# Patient Record
Sex: Female | Born: 1950 | Race: White | Hispanic: No | Smoking: Former smoker
Health system: Southern US, Community
[De-identification: ages and names within clinical notes are randomized; demographics above are authoritative.]

## PROBLEM LIST (undated history)

## (undated) DIAGNOSIS — J189 Pneumonia, unspecified organism: Secondary | ICD-10-CM

## (undated) DIAGNOSIS — R636 Underweight: Secondary | ICD-10-CM

## (undated) DIAGNOSIS — J690 Pneumonitis due to inhalation of food and vomit: Secondary | ICD-10-CM

## (undated) DIAGNOSIS — F0280 Dementia in other diseases classified elsewhere without behavioral disturbance: Secondary | ICD-10-CM

## (undated) DIAGNOSIS — R296 Repeated falls: Secondary | ICD-10-CM

## (undated) DIAGNOSIS — I639 Cerebral infarction, unspecified: Secondary | ICD-10-CM

## (undated) DIAGNOSIS — I2510 Atherosclerotic heart disease of native coronary artery without angina pectoris: Secondary | ICD-10-CM

## (undated) DIAGNOSIS — N289 Disorder of kidney and ureter, unspecified: Secondary | ICD-10-CM

## (undated) DIAGNOSIS — L89310 Pressure ulcer of right buttock, unstageable: Secondary | ICD-10-CM

## (undated) DIAGNOSIS — R1312 Dysphagia, oropharyngeal phase: Secondary | ICD-10-CM

## (undated) DIAGNOSIS — I252 Old myocardial infarction: Secondary | ICD-10-CM

## (undated) DIAGNOSIS — H269 Unspecified cataract: Secondary | ICD-10-CM

## (undated) DIAGNOSIS — F329 Major depressive disorder, single episode, unspecified: Secondary | ICD-10-CM

## (undated) DIAGNOSIS — F32A Depression, unspecified: Secondary | ICD-10-CM

## (undated) DIAGNOSIS — F209 Schizophrenia, unspecified: Secondary | ICD-10-CM

## (undated) DIAGNOSIS — Z515 Encounter for palliative care: Secondary | ICD-10-CM

## (undated) DIAGNOSIS — G309 Alzheimer's disease, unspecified: Secondary | ICD-10-CM

## (undated) DIAGNOSIS — M419 Scoliosis, unspecified: Secondary | ICD-10-CM

## (undated) DIAGNOSIS — S0285XA Fracture of orbit, unspecified, initial encounter for closed fracture: Secondary | ICD-10-CM

## (undated) HISTORY — DX: Old myocardial infarction: I25.2

## (undated) HISTORY — DX: Unspecified cataract: H26.9

## (undated) HISTORY — DX: Underweight: R63.6

## (undated) HISTORY — DX: Depression, unspecified: F32.A

## (undated) HISTORY — DX: Encounter for palliative care: Z51.5

## (undated) HISTORY — DX: Atherosclerotic heart disease of native coronary artery without angina pectoris: I25.10

## (undated) HISTORY — DX: Major depressive disorder, single episode, unspecified: F32.9

## (undated) HISTORY — PX: CORONARY ANGIOPLASTY WITH STENT PLACEMENT: SHX49

## (undated) HISTORY — DX: Dysphagia, oropharyngeal phase: R13.12

## (undated) HISTORY — DX: Pressure ulcer of right buttock, unstageable: L89.310

## (undated) HISTORY — DX: Repeated falls: R29.6

---

## 2012-05-25 ENCOUNTER — Other Ambulatory Visit: Payer: Self-pay | Admitting: Family Medicine

## 2012-05-25 ENCOUNTER — Ambulatory Visit
Admission: RE | Admit: 2012-05-25 | Discharge: 2012-05-25 | Disposition: A | Discharge status: Home / Self Care | Payer: No Typology Code available for payment source | Source: Ambulatory Visit | Attending: Family Medicine | Admitting: Family Medicine

## 2012-05-25 DIAGNOSIS — M25476 Effusion, unspecified foot: Secondary | ICD-10-CM

## 2012-05-25 DIAGNOSIS — M79672 Pain in left foot: Secondary | ICD-10-CM

## 2012-08-22 ENCOUNTER — Ambulatory Visit
Admission: RE | Admit: 2012-08-22 | Discharge: 2012-08-22 | Disposition: A | Discharge status: Home / Self Care | Payer: No Typology Code available for payment source | Source: Ambulatory Visit | Attending: Family Medicine | Admitting: Family Medicine

## 2012-08-22 ENCOUNTER — Other Ambulatory Visit: Payer: Self-pay | Admitting: Family Medicine

## 2012-08-22 DIAGNOSIS — IMO0001 Reserved for inherently not codable concepts without codable children: Secondary | ICD-10-CM

## 2013-06-26 ENCOUNTER — Other Ambulatory Visit: Payer: Self-pay | Admitting: *Deleted

## 2013-06-26 ENCOUNTER — Ambulatory Visit
Admission: RE | Admit: 2013-06-26 | Discharge: 2013-06-26 | Disposition: A | Discharge status: Home / Self Care | Payer: No Typology Code available for payment source | Source: Ambulatory Visit | Attending: *Deleted | Admitting: *Deleted

## 2013-06-26 DIAGNOSIS — R6883 Chills (without fever): Secondary | ICD-10-CM

## 2013-09-12 ENCOUNTER — Emergency Department (HOSPITAL_BASED_OUTPATIENT_CLINIC_OR_DEPARTMENT_OTHER): Payer: Medicare (Managed Care)

## 2013-09-12 ENCOUNTER — Emergency Department (HOSPITAL_BASED_OUTPATIENT_CLINIC_OR_DEPARTMENT_OTHER)
Admission: EM | Admit: 2013-09-12 | Discharge: 2013-09-12 | Disposition: A | Discharge status: Home / Self Care | Payer: Medicare (Managed Care) | Attending: Emergency Medicine | Admitting: Emergency Medicine

## 2013-09-12 ENCOUNTER — Encounter (HOSPITAL_BASED_OUTPATIENT_CLINIC_OR_DEPARTMENT_OTHER): Payer: Self-pay | Admitting: Emergency Medicine

## 2013-09-12 DIAGNOSIS — W19XXXA Unspecified fall, initial encounter: Secondary | ICD-10-CM

## 2013-09-12 DIAGNOSIS — Z8673 Personal history of transient ischemic attack (TIA), and cerebral infarction without residual deficits: Secondary | ICD-10-CM | POA: Insufficient documentation

## 2013-09-12 DIAGNOSIS — Z8701 Personal history of pneumonia (recurrent): Secondary | ICD-10-CM | POA: Insufficient documentation

## 2013-09-12 DIAGNOSIS — Z8781 Personal history of (healed) traumatic fracture: Secondary | ICD-10-CM | POA: Insufficient documentation

## 2013-09-12 DIAGNOSIS — Z880 Allergy status to penicillin: Secondary | ICD-10-CM | POA: Insufficient documentation

## 2013-09-12 DIAGNOSIS — F209 Schizophrenia, unspecified: Secondary | ICD-10-CM | POA: Insufficient documentation

## 2013-09-12 DIAGNOSIS — Z87448 Personal history of other diseases of urinary system: Secondary | ICD-10-CM | POA: Insufficient documentation

## 2013-09-12 DIAGNOSIS — S0180XA Unspecified open wound of other part of head, initial encounter: Secondary | ICD-10-CM | POA: Insufficient documentation

## 2013-09-12 DIAGNOSIS — Y92009 Unspecified place in unspecified non-institutional (private) residence as the place of occurrence of the external cause: Secondary | ICD-10-CM | POA: Insufficient documentation

## 2013-09-12 DIAGNOSIS — Z87891 Personal history of nicotine dependence: Secondary | ICD-10-CM | POA: Insufficient documentation

## 2013-09-12 DIAGNOSIS — Z9861 Coronary angioplasty status: Secondary | ICD-10-CM | POA: Insufficient documentation

## 2013-09-12 DIAGNOSIS — Z8739 Personal history of other diseases of the musculoskeletal system and connective tissue: Secondary | ICD-10-CM | POA: Insufficient documentation

## 2013-09-12 DIAGNOSIS — Z79899 Other long term (current) drug therapy: Secondary | ICD-10-CM | POA: Insufficient documentation

## 2013-09-12 DIAGNOSIS — S51009A Unspecified open wound of unspecified elbow, initial encounter: Secondary | ICD-10-CM | POA: Insufficient documentation

## 2013-09-12 DIAGNOSIS — Y939 Activity, unspecified: Secondary | ICD-10-CM | POA: Insufficient documentation

## 2013-09-12 DIAGNOSIS — W1809XA Striking against other object with subsequent fall, initial encounter: Secondary | ICD-10-CM | POA: Insufficient documentation

## 2013-09-12 DIAGNOSIS — G309 Alzheimer's disease, unspecified: Secondary | ICD-10-CM | POA: Insufficient documentation

## 2013-09-12 DIAGNOSIS — Z23 Encounter for immunization: Secondary | ICD-10-CM | POA: Insufficient documentation

## 2013-09-12 DIAGNOSIS — Z7982 Long term (current) use of aspirin: Secondary | ICD-10-CM | POA: Insufficient documentation

## 2013-09-12 DIAGNOSIS — F0280 Dementia in other diseases classified elsewhere without behavioral disturbance: Secondary | ICD-10-CM | POA: Insufficient documentation

## 2013-09-12 DIAGNOSIS — S0191XA Laceration without foreign body of unspecified part of head, initial encounter: Secondary | ICD-10-CM

## 2013-09-12 HISTORY — DX: Cerebral infarction, unspecified: I63.9

## 2013-09-12 HISTORY — DX: Scoliosis, unspecified: M41.9

## 2013-09-12 HISTORY — DX: Alzheimer's disease, unspecified: G30.9

## 2013-09-12 HISTORY — DX: Fracture of orbit, unspecified, initial encounter for closed fracture: S02.85XA

## 2013-09-12 HISTORY — DX: Disorder of kidney and ureter, unspecified: N28.9

## 2013-09-12 HISTORY — DX: Schizophrenia, unspecified: F20.9

## 2013-09-12 HISTORY — DX: Pneumonia, unspecified organism: J18.9

## 2013-09-12 HISTORY — DX: Dementia in other diseases classified elsewhere, unspecified severity, without behavioral disturbance, psychotic disturbance, mood disturbance, and anxiety: F02.80

## 2013-09-12 MED ORDER — TETANUS-DIPHTH-ACELL PERTUSSIS 5-2.5-18.5 LF-MCG/0.5 IM SUSP
0.5000 mL | Freq: Once | INTRAMUSCULAR | Status: AC
  Administered 2013-09-12: 0.5 mL via INTRAMUSCULAR
  Filled 2013-09-12: qty 0.5

## 2013-09-12 NOTE — ED Notes (Signed)
Pt lost balance at home and fell forward onto face on kitchen floor.  Lac to right eyebrow and skin tear to right elbow. No LOC.  No blood thinners.

## 2013-09-12 NOTE — ED Provider Notes (Signed)
CSN: [id number]     Arrival date & time 09/12/13  1739 History   First MD Initiated Contact with Patient 09/12/13 1752     Chief Complaint  Patient presents with  . Fall  . Facial Laceration   (Consider location/radiation/quality/duration/timing/severity/associated sxs/prior Treatment) HPI  Schatzman stuttered is a 62 year old female with a past medical history of Alzheimer's dementia and schizophrenia who presents to the emergency department after fall.  There is a level 5 caveat 2 to patient's mental status and dementia.  According to nursing notes the patient fell at home onto the kitchen floor and suffered a laceration to the right eyebrow as well as a small skin tear to the right elbow.  Patient is not taking any blood thinners.  Past Medical History  Diagnosis Date  . Scoliosis   . Alzheimer disease   . Schizophrenia   . CVA (cerebral infarction)   . Pneumonia   . Renal disorder   . Orbital fracture    Past Surgical History  Procedure Laterality Date  . Coronary angioplasty with stent placement     No family history on file. History  Substance Use Topics  . Smoking status: Former Games developer  . Smokeless tobacco: Not on file  . Alcohol Use: No   OB History   Grav Para Term Preterm Abortions TAB SAB Ect Mult Living                 Review of Systems  Unable to perform ROS   Allergies  Penicillins  Home Medications   Current Outpatient Rx  Name  Route  Sig  Dispense  Refill  . ARIPiprazole (ABILIFY) 30 MG tablet   Oral   Take 30 mg by mouth daily.         Marland Kitchen aspirin 81 MG tablet   Oral   Take 81 mg by mouth daily.         . clonazePAM (KLONOPIN) 1 MG tablet   Oral   Take 0.5 mg by mouth 2 (two) times daily as needed for anxiety.         . divalproex (DEPAKOTE) 250 MG DR tablet   Oral   Take 500 mg by mouth daily.         . Multiple Vitamins-Minerals (MULTIVITAMIN WITH MINERALS) tablet   Oral   Take 1 tablet by mouth daily.         .  sertraline (ZOLOFT) 100 MG tablet   Oral   Take 100 mg by mouth daily.         Marland Kitchen UNABLE TO FIND      Unable to obtain med information.          BP 149/84  Pulse 76  Temp(Src) 97.7 F (36.5 C) (Oral)  Resp 16  Ht 5\' 5"  (1.651 m)  Wt 125 lb (56.7 kg)  BMI 20.8 kg/m2  SpO2 98% Physical Exam  Constitutional: She is oriented to person, place, and time. She appears well-developed and well-nourished. No distress.  HENT:  1 cm laceration to the right eyebrow.  Bleeding is well controlled  Eyes: Conjunctivae are normal. No scleral icterus.  Neck: Normal range of motion.  C collar in place  Cardiovascular: Normal rate, regular rhythm and normal heart sounds.  Exam reveals no gallop and no friction rub.   No murmur heard. Pulmonary/Chest: Effort normal and breath sounds normal. No respiratory distress.  Abdominal: Soft. Bowel sounds are normal. She exhibits no distension and no mass. There is no  tenderness. There is no guarding.  Musculoskeletal:  Bruising or deformity.  No ecchymosis noted.  Neurological: She is alert and oriented to person, place, and time.  Skin: Skin is warm and dry. She is not diaphoretic.  Small skin tear R elbow.     ED Course  Procedures (including critical care time) Labs Review Labs Reviewed - No data to display Imaging Review No results found.  MDM  No diagnosis found. Patient here after fall. She arrives via ems on spinal precautions.  T dap updated . Labs/ imaging pending.  Imaging and labs without acute abnormality. Lac repair with dermabond. The patient appears reasonably screened and/or stabilized for discharge and I doubt any other medical condition or other [facility] requiring further screening, evaluation, or treatment in the ED at this time prior to discharge.   Arthor Captain, PA-C 09/15/13 [phone]

## 2013-09-13 ENCOUNTER — Other Ambulatory Visit: Payer: Self-pay | Admitting: *Deleted

## 2013-09-13 ENCOUNTER — Ambulatory Visit
Admission: RE | Admit: 2013-09-13 | Discharge: 2013-09-13 | Disposition: A | Discharge status: Home / Self Care | Payer: No Typology Code available for payment source | Source: Ambulatory Visit | Attending: *Deleted | Admitting: *Deleted

## 2013-09-13 DIAGNOSIS — W19XXXA Unspecified fall, initial encounter: Secondary | ICD-10-CM

## 2013-09-15 NOTE — ED Provider Notes (Signed)
Medical screening examination/treatment/procedure(s) were performed by non-physician practitioner and as supervising physician I was immediately available for consultation/collaboration.   Charles B. Sheldon, MD 09/15/13 2031 

## 2013-10-31 ENCOUNTER — Other Ambulatory Visit: Payer: Self-pay | Admitting: Family Medicine

## 2013-10-31 ENCOUNTER — Ambulatory Visit
Admission: RE | Admit: 2013-10-31 | Discharge: 2013-10-31 | Disposition: A | Discharge status: Home / Self Care | Payer: Medicare Other | Source: Ambulatory Visit | Attending: Family Medicine | Admitting: Family Medicine

## 2013-10-31 DIAGNOSIS — S42301D Unspecified fracture of shaft of humerus, right arm, subsequent encounter for fracture with routine healing: Secondary | ICD-10-CM

## 2014-12-11 ENCOUNTER — Other Ambulatory Visit: Payer: Self-pay | Admitting: *Deleted

## 2014-12-11 ENCOUNTER — Ambulatory Visit
Admission: RE | Admit: 2014-12-11 | Discharge: 2014-12-11 | Disposition: A | Discharge status: Home / Self Care | Payer: Medicare (Managed Care) | Source: Ambulatory Visit | Attending: *Deleted | Admitting: *Deleted

## 2014-12-11 DIAGNOSIS — R6883 Chills (without fever): Secondary | ICD-10-CM

## 2015-02-11 ENCOUNTER — Other Ambulatory Visit: Payer: Self-pay | Admitting: Family Medicine

## 2015-02-11 ENCOUNTER — Ambulatory Visit
Admission: RE | Admit: 2015-02-11 | Discharge: 2015-02-11 | Disposition: A | Discharge status: Home / Self Care | Payer: No Typology Code available for payment source | Source: Ambulatory Visit | Attending: Family Medicine | Admitting: Family Medicine

## 2015-02-11 DIAGNOSIS — J189 Pneumonia, unspecified organism: Secondary | ICD-10-CM

## 2015-08-15 ENCOUNTER — Encounter (HOSPITAL_BASED_OUTPATIENT_CLINIC_OR_DEPARTMENT_OTHER): Payer: Self-pay | Admitting: *Deleted

## 2015-08-15 ENCOUNTER — Emergency Department (HOSPITAL_BASED_OUTPATIENT_CLINIC_OR_DEPARTMENT_OTHER)
Admission: EM | Admit: 2015-08-15 | Discharge: 2015-08-15 | Disposition: A | Discharge status: Home / Self Care | Payer: Medicare (Managed Care) | Attending: Emergency Medicine | Admitting: Emergency Medicine

## 2015-08-15 DIAGNOSIS — M419 Scoliosis, unspecified: Secondary | ICD-10-CM | POA: Diagnosis not present

## 2015-08-15 DIAGNOSIS — R8290 Unspecified abnormal findings in urine: Secondary | ICD-10-CM

## 2015-08-15 DIAGNOSIS — Z8673 Personal history of transient ischemic attack (TIA), and cerebral infarction without residual deficits: Secondary | ICD-10-CM | POA: Insufficient documentation

## 2015-08-15 DIAGNOSIS — R7989 Other specified abnormal findings of blood chemistry: Secondary | ICD-10-CM | POA: Insufficient documentation

## 2015-08-15 DIAGNOSIS — Z87891 Personal history of nicotine dependence: Secondary | ICD-10-CM | POA: Diagnosis not present

## 2015-08-15 DIAGNOSIS — R8299 Other abnormal findings in urine: Secondary | ICD-10-CM | POA: Insufficient documentation

## 2015-08-15 DIAGNOSIS — Z87448 Personal history of other diseases of urinary system: Secondary | ICD-10-CM | POA: Insufficient documentation

## 2015-08-15 DIAGNOSIS — Z8781 Personal history of (healed) traumatic fracture: Secondary | ICD-10-CM | POA: Diagnosis not present

## 2015-08-15 DIAGNOSIS — Z880 Allergy status to penicillin: Secondary | ICD-10-CM | POA: Diagnosis not present

## 2015-08-15 DIAGNOSIS — Z7982 Long term (current) use of aspirin: Secondary | ICD-10-CM | POA: Diagnosis not present

## 2015-08-15 DIAGNOSIS — Z8701 Personal history of pneumonia (recurrent): Secondary | ICD-10-CM | POA: Diagnosis not present

## 2015-08-15 DIAGNOSIS — Z9861 Coronary angioplasty status: Secondary | ICD-10-CM | POA: Insufficient documentation

## 2015-08-15 DIAGNOSIS — F209 Schizophrenia, unspecified: Secondary | ICD-10-CM | POA: Insufficient documentation

## 2015-08-15 DIAGNOSIS — Z79899 Other long term (current) drug therapy: Secondary | ICD-10-CM | POA: Diagnosis not present

## 2015-08-15 DIAGNOSIS — R319 Hematuria, unspecified: Secondary | ICD-10-CM | POA: Diagnosis present

## 2015-08-15 DIAGNOSIS — G309 Alzheimer's disease, unspecified: Secondary | ICD-10-CM | POA: Insufficient documentation

## 2015-08-15 HISTORY — DX: Pneumonitis due to inhalation of food and vomit: J69.0

## 2015-08-15 LAB — BASIC METABOLIC PANEL
Anion gap: 10 (ref 5–15)
BUN: 24 mg/dL — AB (ref 6–20)
CHLORIDE: 97 mmol/L — AB (ref 101–111)
CO2: 27 mmol/L (ref 22–32)
CREATININE: 1.12 mg/dL — AB (ref 0.44–1.00)
Calcium: 9.3 mg/dL (ref 8.9–10.3)
GFR calc Af Amer: 58 mL/min — ABNORMAL LOW (ref >60)
GFR calc non Af Amer: 50 mL/min — ABNORMAL LOW (ref >60)
Glucose, Bld: 84 mg/dL (ref 65–99)
POTASSIUM: 4 mmol/L (ref 3.5–5.1)
Sodium: 134 mmol/L — ABNORMAL LOW (ref 135–145)

## 2015-08-15 LAB — CBC WITH DIFFERENTIAL/PLATELET
Basophils Absolute: 0.1 10*3/uL (ref 0.0–0.1)
Basophils Relative: 1 % (ref 0–1)
EOS PCT: 3 % (ref 0–5)
Eosinophils Absolute: 0.2 10*3/uL (ref 0.0–0.7)
HCT: 33.6 % — ABNORMAL LOW (ref 36.0–46.0)
Hemoglobin: 11.3 g/dL — ABNORMAL LOW (ref 12.0–15.0)
LYMPHS ABS: 1.6 10*3/uL (ref 0.7–4.0)
Lymphocytes Relative: 26 % (ref 12–46)
MCH: 30.2 pg (ref 26.0–34.0)
MCHC: 33.6 g/dL (ref 30.0–36.0)
MCV: 89.8 fL (ref 78.0–100.0)
MONOS PCT: 10 % (ref 3–12)
Monocytes Absolute: 0.6 10*3/uL (ref 0.1–1.0)
Neutro Abs: 3.6 10*3/uL (ref 1.7–7.7)
Neutrophils Relative %: 60 % (ref 43–77)
PLATELETS: 231 10*3/uL (ref 150–400)
RBC: 3.74 MIL/uL — ABNORMAL LOW (ref 3.87–5.11)
RDW: 12.1 % (ref 11.5–15.5)
WBC: 6.1 10*3/uL (ref 4.0–10.5)

## 2015-08-15 LAB — URINALYSIS, ROUTINE W REFLEX MICROSCOPIC
BILIRUBIN URINE: NEGATIVE
GLUCOSE, UA: NEGATIVE mg/dL
HGB URINE DIPSTICK: NEGATIVE
Ketones, ur: NEGATIVE mg/dL
Leukocytes, UA: NEGATIVE
Nitrite: NEGATIVE
PH: 6 (ref 5.0–8.0)
Protein, ur: NEGATIVE mg/dL
SPECIFIC GRAVITY, URINE: 1.018 (ref 1.005–1.030)
Urobilinogen, UA: 1 mg/dL (ref 0.0–1.0)

## 2015-08-15 MED ORDER — SODIUM CHLORIDE 0.9 % IV BOLUS (SEPSIS)
1000.0000 mL | Freq: Once | INTRAVENOUS | Status: AC
  Administered 2015-08-15: 1000 mL via INTRAVENOUS

## 2015-08-15 NOTE — ED Provider Notes (Signed)
CSN: [id number]     Arrival date & time 08/15/15  1804 History   First MD Initiated Contact with Patient 08/15/15 1824     Chief Complaint  Patient presents with  . Hematuria   Level V Caveat: Dementia  Diana Werner is a 64 y.o. female with a history of advanced Alzheimer's disease, CVA and renal disorder who presents to the emergency department with her daughter-in-law who reports that she is had a strange odor to her urine for the past 2-3 weeks and today she noticed a small amount of blood streaked in her urine. The patient denies any complaints. She denies dysuria. The patient has advanced Alzheimer's. The patient's daughter-in-law reports she has seemed more fatigued recently, but has been otherwise acting appropriately. The patient denies dysuria, abdominal pain or vomiting. The patient's daughter-in-law denies fevers, vomiting, rashes, or acute changes to her mental status. She reports she has been eating and drinking normally.   (Consider location/radiation/quality/duration/timing/severity/associated sxs/prior Treatment) HPI  Past Medical History  Diagnosis Date  . Scoliosis   . Alzheimer disease   . Schizophrenia   . CVA (cerebral infarction)   . Pneumonia   . Renal disorder   . Orbital fracture   . Aspiration pneumonia    Past Surgical History  Procedure Laterality Date  . Coronary angioplasty with stent placement     No family history on file. Social History  Substance Use Topics  . Smoking status: Former Games developer  . Smokeless tobacco: None  . Alcohol Use: No   OB History    No data available     Review of Systems  Unable to perform ROS: Dementia  Constitutional: Negative for fever.  Gastrointestinal: Negative for vomiting, abdominal pain and diarrhea.  Genitourinary: Positive for hematuria. Negative for dysuria and decreased urine volume.  Skin: Negative for rash.  Psychiatric/Behavioral: Negative for agitation.      Allergies  Penicillins  Home  Medications   Prior to Admission medications   Medication Sig Start Date End Date Taking? Authorizing Provider  ARIPiprazole (ABILIFY) 30 MG tablet Take 30 mg by mouth daily.    Historical Provider, MD  aspirin 81 MG tablet Take 81 mg by mouth daily.    Historical Provider, MD  clonazePAM (KLONOPIN) 1 MG tablet Take 0.5 mg by mouth 2 (two) times daily as needed for anxiety.    Historical Provider, MD  divalproex (DEPAKOTE) 250 MG DR tablet Take 500 mg by mouth daily.    Historical Provider, MD  Multiple Vitamins-Minerals (MULTIVITAMIN WITH MINERALS) tablet Take 1 tablet by mouth daily.    Historical Provider, MD  sertraline (ZOLOFT) 100 MG tablet Take 100 mg by mouth daily.    Historical Provider, MD  UNABLE TO FIND Unable to obtain med information.    Historical Provider, MD   BP 106/86 mmHg  Pulse 78  Temp(Src) 99.2 F (37.3 C) (Oral)  Resp 18  Wt 125 lb (56.7 kg)  SpO2 99% Physical Exam  Constitutional: She appears well-developed and well-nourished. No distress.  Nontoxic appearing. Pleasantly demented.  HENT:  Head: Normocephalic and atraumatic.  Mouth/Throat: Oropharynx is clear and moist.  Mucous membranes appear moist.  Eyes: Conjunctivae are normal. Pupils are equal, round, and reactive to light. Right eye exhibits no discharge. Left eye exhibits no discharge.  Neck: Neck supple. No JVD present.  Cardiovascular: Normal rate, regular rhythm, normal heart sounds and intact distal pulses.  Exam reveals no gallop and no friction rub.   No murmur heard. Pulmonary/Chest:  Effort normal and breath sounds normal. No respiratory distress. She has no wheezes. She has no rales.  Lungs are clear to auscultation bilaterally.  Abdominal: Soft. Bowel sounds are normal. She exhibits no distension. There is no tenderness. There is no guarding.  Abdomen is soft and nontender to palpation. No CVA or flank tenderness.  Musculoskeletal: She exhibits no edema.  Lymphadenopathy:    She has no  cervical adenopathy.  Neurological: She is alert. Coordination normal.  The patient is alert and oriented to person and place only.  Skin: Skin is warm and dry. No rash noted. She is not diaphoretic. No erythema. No pallor.  Psychiatric: She has a normal mood and affect. Her behavior is normal.  Pleasantly demented.  Nursing note and vitals reviewed.   ED Course  Procedures (including critical care time) Labs Review Labs Reviewed  BASIC METABOLIC PANEL - Abnormal; Notable for the following:    Sodium 134 (*)    Chloride 97 (*)    BUN 24 (*)    Creatinine, Ser 1.12 (*)    GFR calc non Af Amer 50 (*)    GFR calc Af Amer 58 (*)    All other components within normal limits  CBC WITH DIFFERENTIAL/PLATELET - Abnormal; Notable for the following:    RBC 3.74 (*)    Hemoglobin 11.3 (*)    HCT 33.6 (*)    All other components within normal limits  URINALYSIS, ROUTINE W REFLEX MICROSCOPIC (NOT AT [facility])    Imaging Review No results found. I have personally reviewed and evaluated these lab results as part of my medical decision-making.   EKG Interpretation None      Filed Vitals:   08/15/15 1816 08/15/15 2032 08/15/15 2104  BP: 107/75 133/79 106/86  Pulse: 111 76 78  Temp: 99 F (37.2 C) 99.2 F (37.3 C)   TempSrc: Oral Oral   Resp: 18 18 18   Weight: 125 lb (56.7 kg)    SpO2: 95% 100% 99%     MDM   Meds given in ED:  Medications  sodium chloride 0.9 % bolus 1,000 mL (1,000 mLs Intravenous New Bag/Given 08/15/15 2141)    New Prescriptions   No medications on file   Final diagnoses:  Elevated serum creatinine  Abnormal urine odor    This is a 64 y.o. female with a history of advanced Alzheimer's disease, CVA and renal disorder who presents to the emergency department with her daughter-in-law who reports that she is had a strange odor to her urine for the past 2-3 weeks and today she noticed a small amount of blood streaked in her urine. The patient denies any  complaints. She denies dysuria. The patient has advanced Alzheimer's. The patient's daughter-in-law reports she has seemed more fatigued recently, but has been otherwise acting appropriately. No vomiting or diarrhea. They deny any fevers. On exam the patient is afebrile nontoxic appearing. She is pleasantly demented and follows all commands. She is oriented to person and place only. Her mucous membranes are moist and she has normal skin turgor. She does not appear dehydrated. Her abdomen is soft and nontender to palpation. Cath urine is completely unremarkable. It is nitrite and leukocyte negative. No UTI.  CBC is unremarkable and no leukocytosis. BMP indicates a creatinine of 1.12 with a BUN at 24. I have no baseline labs to compare. This could be the patient's chronic kidney function. Her urinalysis does not indicate dehydration. She does not appear dehydrated on exam. Her mucous members  are moist. We'll provide the patient with a fluid bolus and have her follow-up with her primary care provider to have her creatinine rechecked next week. I advised the patient to follow-up with their primary care provider this week. I advised to return to the emergency department with new or worsening symptoms or new concerns. The patient's daughter in law verbalized understanding and agreement with plan.    This patient was discussed with Dr. Littie Deeds who agrees with assessment and plan.    Everlene Farrier, PA-C 08/15/15 2157  Mirian Mo, MD 08/26/15 [phone]

## 2015-08-15 NOTE — ED Notes (Signed)
Hematuria since this am.

## 2015-08-15 NOTE — Discharge Instructions (Signed)
Please increase the amount of liquid in her diet. Please have her kidney function rechecked next week by her primary care provider. Her Creatinine was 1.12 here today.   Dehydration, Adult Dehydration is when you lose more fluids from the body than you take in. Vital organs like the kidneys, brain, and heart cannot function without a proper amount of fluids and salt. Any loss of fluids from the body can cause dehydration.  CAUSES   Vomiting.  Diarrhea.  Excessive sweating.  Excessive urine output.  Fever. SYMPTOMS  Mild dehydration  Thirst.  Dry lips.  Slightly dry mouth. Moderate dehydration  Very dry mouth.  Sunken eyes.  Skin does not bounce back quickly when lightly pinched and released.  Dark urine and decreased urine production.  Decreased tear production.  Headache. Severe dehydration  Very dry mouth.  Extreme thirst.  Rapid, weak pulse (more than 100 beats per minute at rest).  Cold hands and feet.  Not able to sweat in spite of heat and temperature.  Rapid breathing.  Blue lips.  Confusion and lethargy.  Difficulty being awakened.  Minimal urine production.  No tears. DIAGNOSIS  Your caregiver will diagnose dehydration based on your symptoms and your exam. Blood and urine tests will help confirm the diagnosis. The diagnostic evaluation should also identify the cause of dehydration. TREATMENT  Treatment of mild or moderate dehydration can often be done at home by increasing the amount of fluids that you drink. It is best to drink small amounts of fluid more often. Drinking too much at one time can make vomiting worse. Refer to the home care instructions below. Severe dehydration needs to be treated at the hospital where you will probably be given intravenous (IV) fluids that contain water and electrolytes. HOME CARE INSTRUCTIONS   Ask your caregiver about specific rehydration instructions.  Drink enough fluids to keep your urine clear or  pale yellow.  Drink small amounts frequently if you have nausea and vomiting.  Eat as you normally do.  Avoid:  Foods or drinks high in sugar.  Carbonated drinks.  Juice.  Extremely hot or cold fluids.  Drinks with caffeine.  Fatty, greasy foods.  Alcohol.  Tobacco.  Overeating.  Gelatin desserts.  Wash your hands well to avoid spreading bacteria and viruses.  Only take over-the-counter or prescription medicines for pain, discomfort, or fever as directed by your caregiver.  Ask your caregiver if you should continue all prescribed and over-the-counter medicines.  Keep all follow-up appointments with your caregiver. SEEK MEDICAL CARE IF:  You have abdominal pain and it increases or stays in one area (localizes).  You have a rash, stiff neck, or severe headache.  You are irritable, sleepy, or difficult to awaken.  You are weak, dizzy, or extremely thirsty. SEEK IMMEDIATE MEDICAL CARE IF:   You are unable to keep fluids down or you get worse despite treatment.  You have frequent episodes of vomiting or diarrhea.  You have blood or green matter (bile) in your vomit.  You have blood in your stool or your stool looks black and tarry.  You have not urinated in 6 to 8 hours, or you have only urinated a small amount of very dark urine.  You have a fever.  You faint. MAKE SURE YOU:   Understand these instructions.  Will watch your condition.  Will get help right away if you are not doing well or get worse. Document Released: 11/23/2005 Document Revised: 02/15/2012 Document Reviewed: 07/13/2011 [employer] Patient Information 2015  [employer]. This information is not intended to replace advice given to you by your health care provider. Make sure you discuss any questions you have with your health care provider. Possible Acute Kidney Injury Acute kidney injury is a disease in which there is sudden (acute) damage to the kidneys. The kidneys are 2 organs that  lie on either side of the spine between the middle of the back and the front of the abdomen. The kidneys:  Remove wastes and extra water from the blood.   Produce important hormones. These help keep bones strong, regulate blood pressure, and help create red blood cells.   Balance the fluids and chemicals in the blood and tissues. A small amount of kidney damage may not cause problems, but a large amount of damage may make it difficult or impossible for the kidneys to work the way they should. Acute kidney injury may develop into long-lasting (chronic) kidney disease. It may also develop into a life-threatening disease called end-stage kidney disease. Acute kidney injury can get worse very quickly, so it should be treated right away. Early treatment may prevent other kidney diseases from developing.  CAUSES   A problem with blood flow to the kidneys. This may be caused by:   Blood loss.   Heart disease.   Severe burns.   Liver disease.  Direct damage to the kidneys. This may be caused by:  Some medicines.   A kidney infection.   Poisoning or consuming toxic substances.   A surgical wound.   A blow to the kidney area.   A problem with urine flow. This may be caused by:   Cancer.   Kidney stones.   An enlarged prostate. SYMPTOMS   Swelling (edema) of the legs, ankles, or feet.   Tiredness (lethargy).   Nausea or vomiting.   Confusion.   Problems with urination, such as:   Painful or burning feeling during urination.   Decreased urine production.   Frequent accidents in children who are potty trained.   Bloody urine.   Muscle twitches and cramps.   Shortness of breath.   Seizures.   Chest pain or pressure. Sometimes, no symptoms are present. DIAGNOSIS Acute kidney injury may be detected and diagnosed by tests, including blood, urine, imaging, or kidney biopsy tests.  TREATMENT Treatment of acute kidney injury varies  depending on the cause and severity of the kidney damage. In mild cases, no treatment may be needed. The kidneys may heal on their own. If acute kidney injury is more severe, your caregiver will treat the cause of the kidney damage, help the kidneys heal, and prevent complications from occurring. Severe cases may require a procedure to remove toxic wastes from the body (dialysis) or surgery to repair kidney damage. Surgery may involve:   Repair of a torn kidney.   Removal of an obstruction. Most of the time, you will need to stay overnight at the hospital.  HOME CARE INSTRUCTIONS:  Follow your prescribed diet.  Only take over-the-counter or prescription medicines as directed by your caregiver.  Do not take any new medicines (prescription, over-the-counter, or nutritional supplements) unless approved by your caregiver. Many medicines can worsen your kidney damage or need to have the dose adjusted.   Keep all follow-up appointments as directed by your caregiver.  Observe your condition to make sure you are healing as expected. SEEK IMMEDIATE MEDICAL CARE IF:  You are feeling ill or have severe pain in the back or side.   Your symptoms  return or you have new symptoms.  You have any symptoms of end-stage kidney disease. These include:   Persistent itchiness.   Loss of appetite.   Headaches.   Abnormally dark or light skin.  Numbness in the hands or feet.   Easy bruising.   Frequent hiccups.   Menstruation stops.   You have a fever.  You have increased urine production.  You have pain or bleeding when urinating. MAKE SURE YOU:   Understand these instructions.  Will watch your condition.  Will get help right away if you are not doing well or get worse Document Released: 06/08/2011 Document Revised: 03/20/2013 Document Reviewed: 07/22/2012 [facility] Patient Information 2015 [place], [place]. This information is not intended to replace advice given to you by  your health care provider. Make sure you discuss any questions you have with your health care provider.

## 2015-10-18 ENCOUNTER — Ambulatory Visit
Admission: RE | Admit: 2015-10-18 | Discharge: 2015-10-18 | Disposition: A | Discharge status: Home / Self Care | Payer: No Typology Code available for payment source | Source: Ambulatory Visit | Attending: Family Medicine | Admitting: Family Medicine

## 2015-10-18 ENCOUNTER — Other Ambulatory Visit: Payer: Self-pay | Admitting: Family Medicine

## 2015-10-18 DIAGNOSIS — R609 Edema, unspecified: Secondary | ICD-10-CM

## 2015-12-30 ENCOUNTER — Other Ambulatory Visit: Payer: Self-pay | Admitting: Family Medicine

## 2015-12-30 DIAGNOSIS — M4802 Spinal stenosis, cervical region: Secondary | ICD-10-CM

## 2015-12-30 DIAGNOSIS — R29898 Other symptoms and signs involving the musculoskeletal system: Secondary | ICD-10-CM

## 2016-01-03 ENCOUNTER — Ambulatory Visit
Admission: RE | Admit: 2016-01-03 | Discharge: 2016-01-03 | Disposition: A | Discharge status: Home / Self Care | Payer: No Typology Code available for payment source | Source: Ambulatory Visit | Attending: Family Medicine | Admitting: Family Medicine

## 2016-01-03 DIAGNOSIS — R29898 Other symptoms and signs involving the musculoskeletal system: Secondary | ICD-10-CM

## 2016-01-03 DIAGNOSIS — M4802 Spinal stenosis, cervical region: Secondary | ICD-10-CM

## 2016-01-03 MED ORDER — IOPAMIDOL (ISOVUE-300) INJECTION 61%
75.0000 mL | Freq: Once | INTRAVENOUS | Status: AC | PRN
  Administered 2016-01-03: 75 mL via INTRAVENOUS

## 2016-01-23 ENCOUNTER — Ambulatory Visit: Payer: Medicare (Managed Care) | Admitting: Neurology

## 2016-02-10 ENCOUNTER — Encounter: Payer: Self-pay | Admitting: Neurology

## 2016-02-10 ENCOUNTER — Ambulatory Visit (INDEPENDENT_AMBULATORY_CARE_PROVIDER_SITE_OTHER): Payer: Medicare (Managed Care) | Admitting: Neurology

## 2016-02-10 VITALS — BP 153/91 | HR 87

## 2016-02-10 DIAGNOSIS — F0391 Unspecified dementia with behavioral disturbance: Secondary | ICD-10-CM

## 2016-02-10 DIAGNOSIS — M2450 Contracture, unspecified joint: Secondary | ICD-10-CM | POA: Diagnosis not present

## 2016-02-10 DIAGNOSIS — G319 Degenerative disease of nervous system, unspecified: Secondary | ICD-10-CM

## 2016-02-10 DIAGNOSIS — F03918 Unspecified dementia, unspecified severity, with other behavioral disturbance: Secondary | ICD-10-CM | POA: Insufficient documentation

## 2016-02-10 NOTE — Patient Instructions (Signed)
The patient is on clonazepam 1mg  daily, sometimes can cause oversedation, paradoxic agitations in this group of patient, significant brain atrophy, dementia behavior issue at baseline,  Keep Abilify 20 mg daily,  If she has worsening agitations, may consider higher dose of Depakote,

## 2016-02-10 NOTE — Progress Notes (Signed)
PATIENT: Diana Werner DOB: Jul 14, 1951  Chief Complaint  Patient presents with  . Contractures    She is here with her social worker, Marylu Lund, who works with [employer]. She is here to have her contractures, in her upper and lower extremities, further evaluated.. She is currently having occupational therapy weekly.  She lives with her son Diana Werner) and daugher-in-law. Marchelle Folks).    . Schizophrenia    Marylu Lund reports she is being treated by her PCP.  Marland Kitchen Dementia    Marylu Lund reports she is being treated by her PCP.     HISTORICAL  Diana Werner is a 65 yo RH female, accompanied by her social worker Leighton Ruff,  seen in refer by Dr. Marny Lowenstein.  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, peripheral vascular disease,   Social worker Marylu Lund who has worked with patient over the past few years, indicate patient has a rapid decline since 2016, from ambulating, talking to current wheelchair bound since summer of 2017, over the past 6 months, she also developed gradual muscle contraction of bilateral lower extremity, and upper extremity, left worse than right,  She was admitted to [facility] February 2017 for malnutrition, rapid decline, decreased by mouth intake, I was able to review hospital records, she had hyponatremia, PAC to placement January 27 2016,  I personally reviewed CAT scan of cervical spine, mild degenerative changes, no significant foraminal stenosis, CT of head, in comparison to previous scan in 2014, generalized atrophy, enlarged ventricle, periventricular  small vessel disease.  She currently lives at home with her daughter's family, under guardiance through social service, she is no longer able to feed herself, wheelchair dependent, bowel and bladder incontinence, had right buttock area decubitus ulcer, slurred speech,  She still [facility]ends [facility] twice a week, also have PT OT, home care few hours each day,  She is referred here to evaluate for her rapid decline in functional status, and development of bilateral lower and upper extremity spasticity, contraction  REVIEW OF SYSTEMS: Full 14 system review of systems performed and notable only for trouble swallowing, memory loss, achy muscles  ALLERGIES: Allergies  Allergen Reactions  . Pioglitazone Swelling  . Bee Venom   . Hydrocodone-Acetaminophen     LOW BP LOW BP  . Iodinated Diagnostic Agents   . Latex   . Penicillins   . Shellfish-Derived Products     HOME MEDICATIONS: Current Outpatient Prescriptions  Medication Sig Dispense Refill  . acetaminophen (TYLENOL) 325 MG tablet Take 650 mg by mouth as needed.    . ARIPiprazole (ABILIFY) 20 MG tablet Take by mouth daily.    Marland Kitchen aspirin 81 MG tablet Take 81 mg by mouth daily.    Marland Kitchen aspirin EC 81 MG tablet Take 81 mg by mouth daily.    . calcium carbonate (CALCIUM 600) 600 MG TABS tablet Take by mouth.    . Cholecalciferol (VITAMIN D3) 1000 units CAPS Take by mouth daily.    . clonazePAM (KLONOPIN) 1 MG tablet Take 0.5 mg by mouth 2 (two) times daily as needed for anxiety.    . clonazePAM (KLONOPIN) 1 MG tablet  Take 1 mg by mouth.    . divalproex (DEPAKOTE) 250 MG DR tablet Take 500 mg by mouth daily.    . divalproex (DEPAKOTE) 250 MG DR tablet Take 250 mg by mouth.    . donepezil (ARICEPT) 10 MG tablet Take 10 mg by mouth.    . food thickener (THICK IT) POWD Take by mouth as needed.    . hydroxypropyl methylcellulose / hypromellose (ISOPTO TEARS / GONIOVISC) 2.5 % ophthalmic solution 1 drop as needed for dry eyes.    . IRON  PO Take by mouth.    . Multiple Vitamins-Minerals (MULTIVITAMIN WITH MINERALS) tablet Take 1 tablet by mouth daily.    Marland Kitchen. senna (SENOKOT) 8.6 MG tablet Take by mouth.    . sertraline (ZOLOFT) 100 MG tablet Take 100 mg by mouth daily.    . sertraline (ZOLOFT) 100 MG tablet Take 100 mg by mouth.    Marland Kitchen. UNABLE TO FIND Unable to obtain med information.     No current facility-administered medications for this visit.    PAST MEDICAL HISTORY: Past Medical History  Diagnosis Date  . Scoliosis   . Alzheimer disease   . Schizophrenia (HCC)   . CVA (cerebral infarction)   . Pneumonia   . Renal disorder   . Orbital fracture (HCC)   . Aspiration pneumonia (HCC)     PAST SURGICAL HISTORY: Past Surgical History  Procedure Laterality Date  . Coronary angioplasty with stent placement      FAMILY HISTORY: No family history on file.  SOCIAL HISTORY:  Social History   Social History  . Marital Status: Widowed    Spouse Name: N/A  . Number of Children: 2  . Years of Education: N/A   Occupational History  . Disabled    Social History Main Topics  . Smoking status: Former Games developermoker  . Smokeless tobacco: Not on file  . Alcohol Use: No  . Drug Use: No  . Sexual Activity: Not on file   Other Topics Concern  . Not on file   Social History Narrative   Lives with son and daughter-in-law.   Right-handed.   No caffeine use.     PHYSICAL EXAM   Filed Vitals:   02/10/16 1324  BP: 153/91  Pulse: 87    Not recorded      There is no weight on file to calculate BMI.  PHYSICAL EXAMNIATION:  Gen: NAD, conversant, well nourised, obese, well groomed                     Cardiovascular: Regular rate rhythm, no peripheral edema, warm, nontender. Eyes: Conjunctivae clear without exudates or hemorrhage Neck: Supple, no carotid bruise. Pulmonary: Clear to auscultation bilaterally   NEUROLOGICAL EXAM:  MENTAL STATUS: Speech/Cognition:  She lying seen wheelchair, slurred endentures  speech, it is difficult to perform Mini-Mental Status Examination due to her erratic behavior, not complying with neurological examination, she is not oriented to date, age,   CRANIAL NERVES: CN II: Visual fields are full to confrontation. Pupils are round equal and briskly reactive to light. CN III, IV, VI: extraocular movement are normal. No ptosis. CN V: Facial sensation is intact to pinprick in all 3 divisions bilaterally. Corneal responses are intact.  CN VII: Face is symmetric with normal eye closure and smile. CN VIII: Hearing is normal to rubbing fingers CN IX, X: Palate elevates symmetrically. Phonation is normal. CN XI: Head turning and shoulder shrug are intact CN XII: Slow spastic tongue movement  MOTOR:  she has significant contraction specificity of bilateral upper extremity, left worse than right, there was antigravity movement of bilateral upper extremity, fixed contraction of bilateral finger flexion, wrist flexion, moderate specificity of bilateral lower extremity, antigravity movement,   REFLEXES: Reflexes are hyperreflexia and symmetric at the biceps, triceps, knees, and ankles. Plantar responses are flexor.  SENSORY: Withdraw to pain   COORDINATION: No significant dysmetric noticed  GAIT/STANCE: Deferred    DIAGNOSTIC DATA (LABS, IMAGING, TESTING) - I reviewed patient records, labs, notes, testing and imaging myself where available.   ASSESSMENT AND PLAN  Alandra P Schlachter is a 65 y.o. female   Advanced dementia history of schizophrenia Rapid progressive bilateral upper and lower extremity spasticity  She has decortical posturing, localized the lesion to central nervous system, most likely bilateral cortical region,  Which is confirmed by progression of her brain atrophy, extensive subcortical white matter disease by CAT scan, progressed compared to previous scan  She will likely continue to decline, at high risk for developing complications, such as  decubitus ulcer, seizure, aspiration pneumonia Agitations  Clonazepam 1 mg every day may not be the best option  Continue and Abilify 20 mg daily  May consider higher dose of Depakote  Levert Feinstein, M.D. Ph.D.  [facility] [address] [place], [place] [address] Ph: [phone] Fax: [phone]  CC: Referring Provider

## 2016-05-05 ENCOUNTER — Encounter: Payer: Self-pay | Admitting: Internal Medicine

## 2016-05-05 DIAGNOSIS — L89310 Pressure ulcer of right buttock, unstageable: Secondary | ICD-10-CM | POA: Insufficient documentation

## 2016-05-05 DIAGNOSIS — F209 Schizophrenia, unspecified: Secondary | ICD-10-CM | POA: Insufficient documentation

## 2016-05-05 DIAGNOSIS — I2510 Atherosclerotic heart disease of native coronary artery without angina pectoris: Secondary | ICD-10-CM | POA: Insufficient documentation

## 2016-05-05 DIAGNOSIS — Z515 Encounter for palliative care: Secondary | ICD-10-CM | POA: Insufficient documentation

## 2016-05-05 DIAGNOSIS — R636 Underweight: Secondary | ICD-10-CM | POA: Insufficient documentation

## 2016-05-05 DIAGNOSIS — R1312 Dysphagia, oropharyngeal phase: Secondary | ICD-10-CM | POA: Insufficient documentation

## 2016-05-05 DIAGNOSIS — R296 Repeated falls: Secondary | ICD-10-CM | POA: Insufficient documentation

## 2016-05-05 DIAGNOSIS — F329 Major depressive disorder, single episode, unspecified: Secondary | ICD-10-CM | POA: Insufficient documentation

## 2016-05-05 DIAGNOSIS — I252 Old myocardial infarction: Secondary | ICD-10-CM | POA: Insufficient documentation

## 2016-05-05 DIAGNOSIS — H269 Unspecified cataract: Secondary | ICD-10-CM | POA: Insufficient documentation

## 2016-05-05 DIAGNOSIS — F32A Depression, unspecified: Secondary | ICD-10-CM | POA: Insufficient documentation

## 2016-05-05 NOTE — Progress Notes (Signed)
Opened in error

## 2016-05-07 DEATH — deceased

## 2017-09-03 IMAGING — CT CT CERVICAL SPINE W/ CM
3 of 4 series · 13 of 33 positions shown, 16 images · IV contrast (iopamidol)
Comparison: Cervical spine CT 11/30/2012

CLINICAL DATA: Cervical spinal stenosis.  Bilateral hand weakness.

Creatinine was obtained on site at [HOSPITAL] at [HOSPITAL].
Results: Creatinine 0.9 mg/dL.
EXAM:
CT CERVICAL SPINE WITH CONTRAST
TECHNIQUE: Multidetector CT imaging of the cervical spine was performed during
intravenous contrast administration. Multiplanar CT image
reconstructions were also generated.
CONTRAST:  75mL [id number] IOPAMIDOL ([id number]) INJECTION 61%

[Series 6: cor · coronal · 0.28mm/px · 3 of 57 slices shown]
[im 12/57  bone]
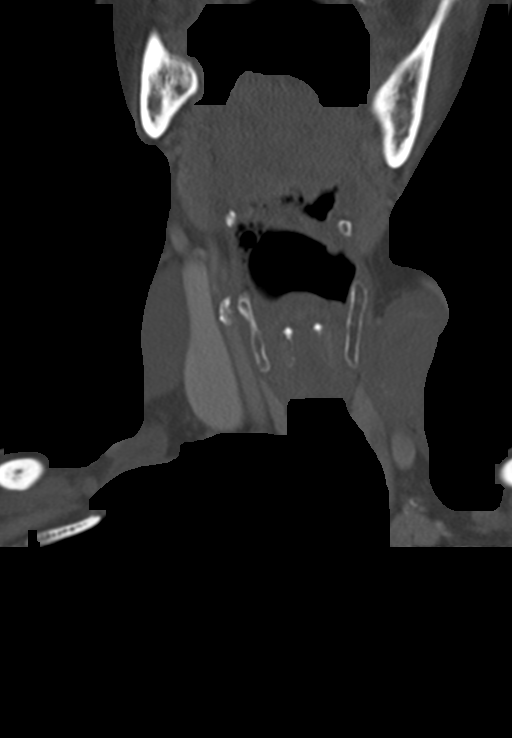
[im 23/57  bone]
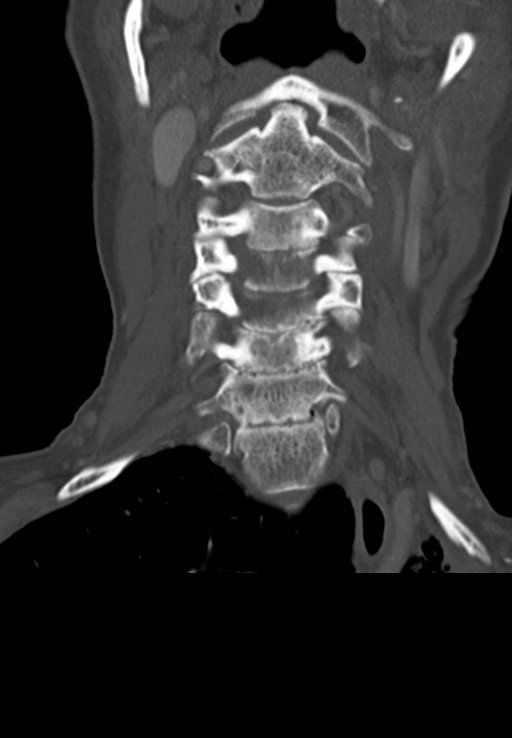
[im 34/57  bone]
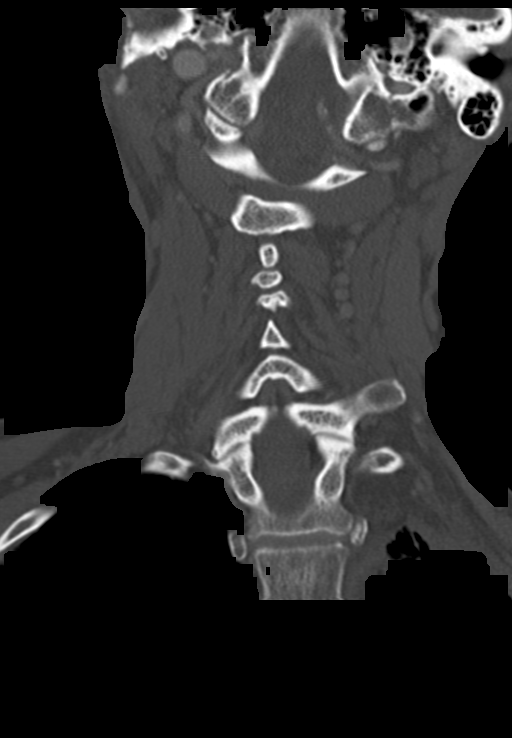

[Series 7: sag · sagittal · 0.26mm/px · 5 of 54 slices shown, 6 images]
[im 18/54  bone]
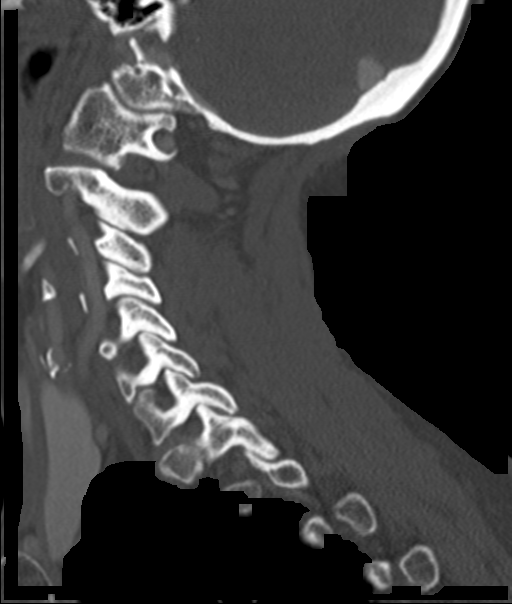
[im 23/54  bone]
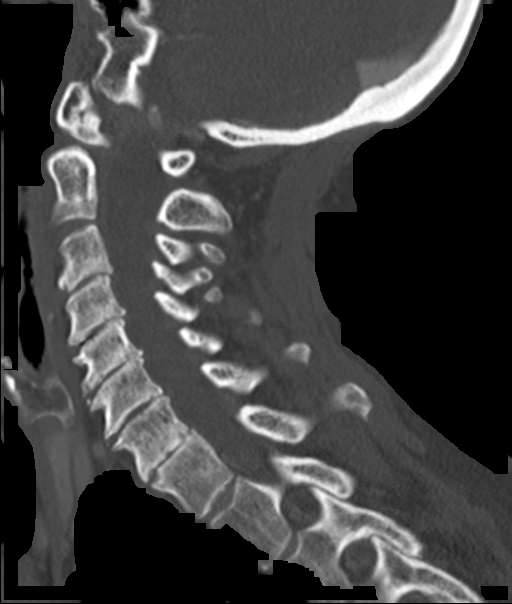
[im 27/54  soft-tissue]
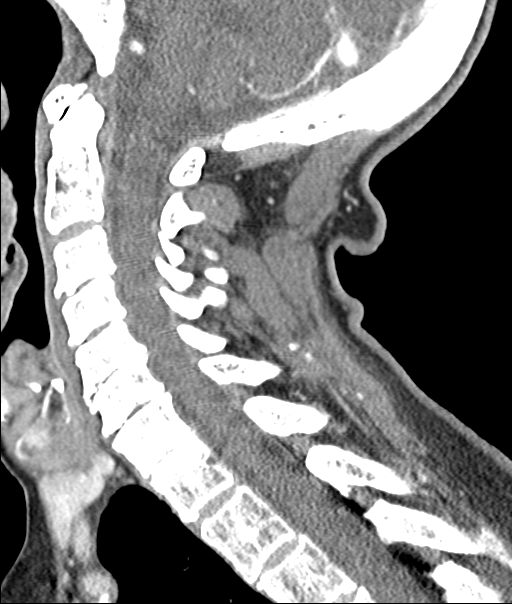
[im 27/54  bone]
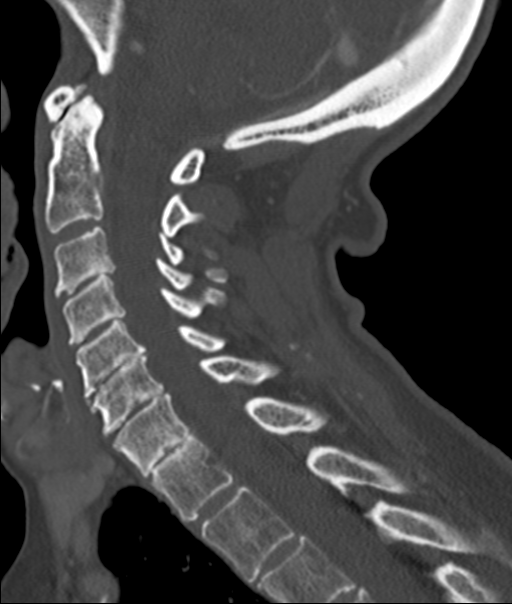
[im 31/54  bone]
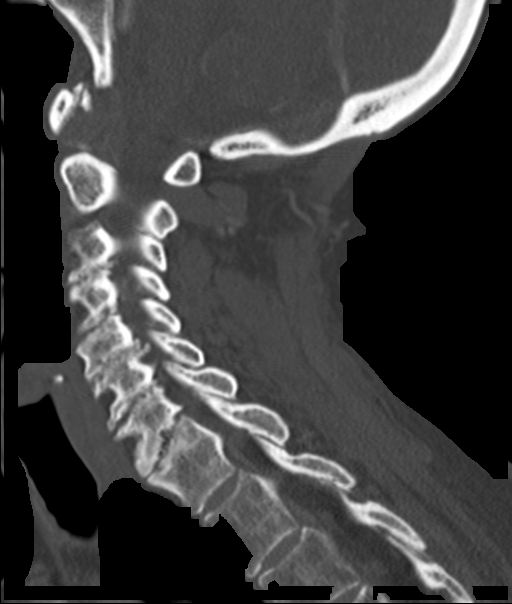
[im 36/54  bone]
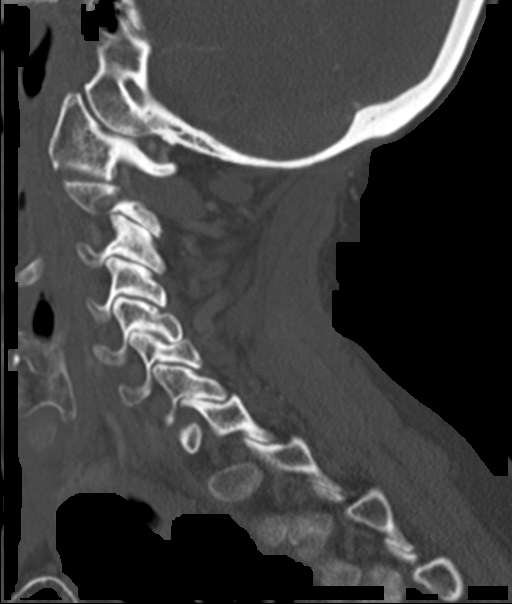

[Series 8: angled axial · axial · 0.27mm/px · z∈[+670,+784]mm · 5 of 92 slices shown, 7 images]
[im 14/92  soft-tissue]
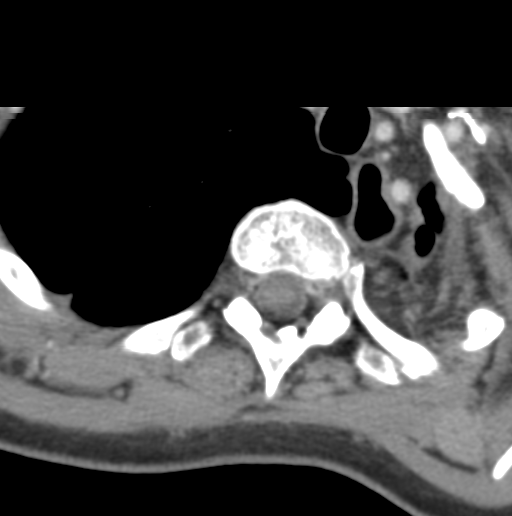
[im 14/92  bone]
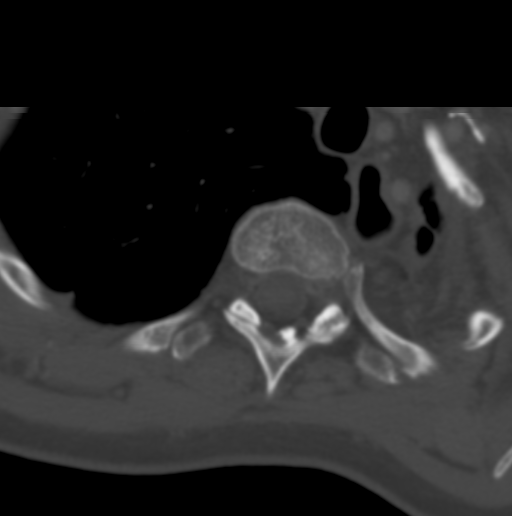
[im 27/92  bone]
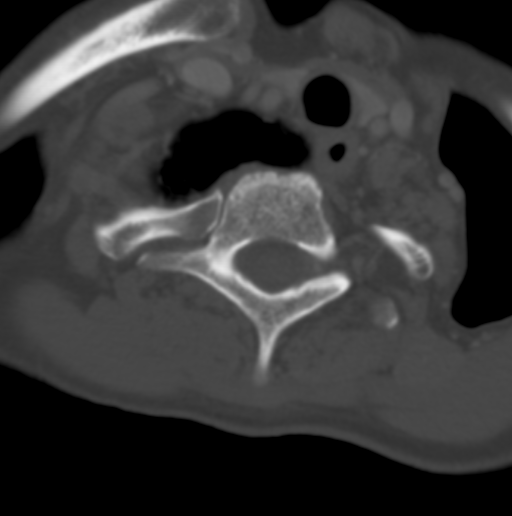
[im 53/92  bone]
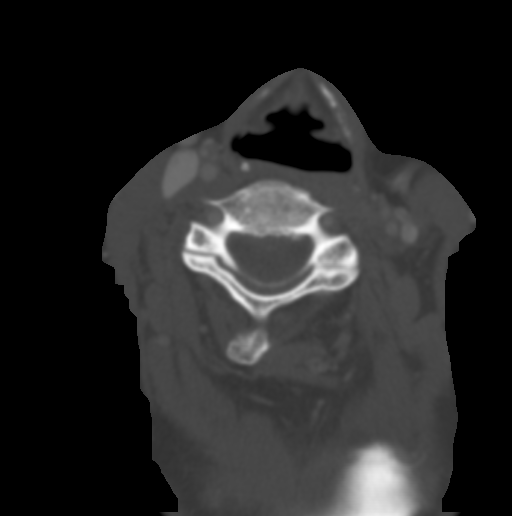
[im 66/92  bone]
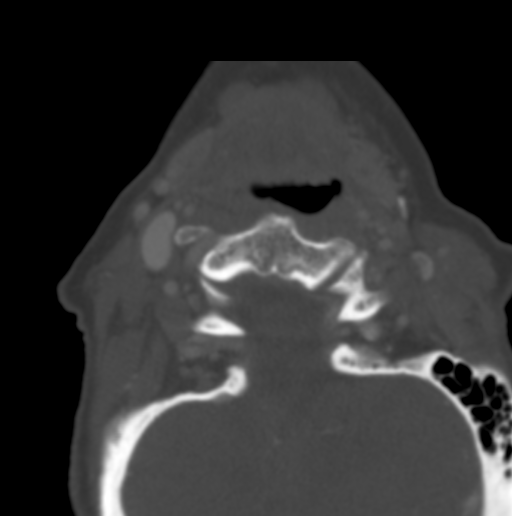
[im 79/92  soft-tissue]
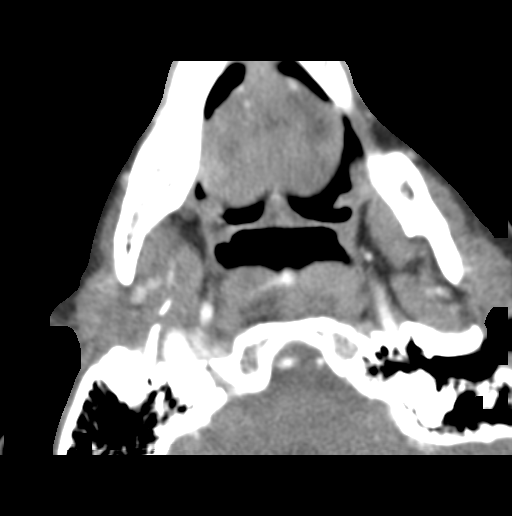
[im 79/92  bone]
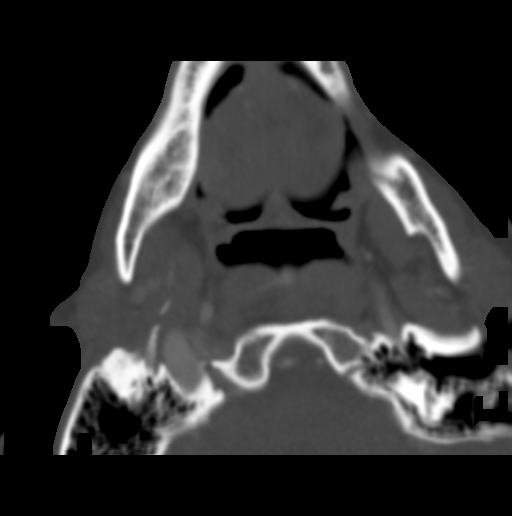

[13 of 33 positions shown; findings below may reference images not displayed]

FINDINGS: There is slight retrolisthesis of C3 on C4 and C4 on C5, unchanged
and likely degenerative. Vertebral body heights are preserved.
Moderate to severe disc space narrowing is present from C3-4 to
C7-T1 with associated degenerative endplate osteophytosis and cystic
change, progressed in the interim at C7-T1. No fracture or
destructive osseous lesion is identified. Calcified atherosclerosis
results in likely mild proximal right ICA stenosis. Chronic volume
loss is partially visualized in the left hemi thorax with
progressive left apical lung consolidation.

C2-3:  Negative.

C3-4: Listhesis and uncovertebral spurring result in new, mild left
neural foraminal stenosis. No spinal stenosis.

C4-5: Listhesis and uncovertebral spurring results in mild right
neural foraminal stenosis without spinal stenosis, unchanged.

C5-6: Broad-based posterior disc osteophyte complex results in
moderate left neural foraminal stenosis and borderline spinal
stenosis, unchanged.

C6-7: Broad-based posterior disc osteophyte complex results in
minimal bilateral neural foraminal narrowing without spinal
stenosis, unchanged.

C7-T1: Progressive disc space height loss without evidence of
stenosis.
IMPRESSION: 1. New, mild left neural foraminal stenosis.
2. Progressive disc degeneration at C7-T1 without stenosis.
3. Unchanged disc degeneration elsewhere as above, worst at C5-6
where there is moderate left neural foraminal stenosis.
4. Chronic left lung volume loss with progressive left apical
consolidation since 7965, incompletely evaluated.
